# Patient Record
Sex: Female | Born: 1970 | Race: Black or African American | Hispanic: No | Marital: Single | Smoking: Heavy tobacco smoker
Health system: Southern US, Community
[De-identification: ages and names within clinical notes are randomized; demographics above are authoritative.]

## PROBLEM LIST (undated history)

## (undated) DIAGNOSIS — I10 Essential (primary) hypertension: Secondary | ICD-10-CM

## (undated) DIAGNOSIS — E119 Type 2 diabetes mellitus without complications: Secondary | ICD-10-CM

---

## 2018-10-10 ENCOUNTER — Emergency Department
Admission: EM | Admit: 2018-10-10 | Discharge: 2018-10-10 | Disposition: A | Discharge status: Home / Self Care | Payer: No Typology Code available for payment source | Attending: Emergency Medicine | Admitting: Emergency Medicine

## 2018-10-10 ENCOUNTER — Other Ambulatory Visit: Payer: Self-pay

## 2018-10-10 ENCOUNTER — Emergency Department: Payer: No Typology Code available for payment source

## 2018-10-10 ENCOUNTER — Encounter: Payer: Self-pay | Admitting: Emergency Medicine

## 2018-10-10 DIAGNOSIS — M7918 Myalgia, other site: Secondary | ICD-10-CM | POA: Diagnosis not present

## 2018-10-10 DIAGNOSIS — E119 Type 2 diabetes mellitus without complications: Secondary | ICD-10-CM | POA: Diagnosis not present

## 2018-10-10 DIAGNOSIS — F17210 Nicotine dependence, cigarettes, uncomplicated: Secondary | ICD-10-CM | POA: Diagnosis not present

## 2018-10-10 DIAGNOSIS — Y9389 Activity, other specified: Secondary | ICD-10-CM | POA: Diagnosis not present

## 2018-10-10 DIAGNOSIS — I10 Essential (primary) hypertension: Secondary | ICD-10-CM | POA: Diagnosis not present

## 2018-10-10 DIAGNOSIS — R0789 Other chest pain: Secondary | ICD-10-CM | POA: Diagnosis present

## 2018-10-10 HISTORY — DX: Essential (primary) hypertension: I10

## 2018-10-10 HISTORY — DX: Type 2 diabetes mellitus without complications: E11.9

## 2018-10-10 MED ORDER — CYCLOBENZAPRINE HCL 5 MG PO TABS: 5.0000 mg | ORAL_TABLET | Freq: Three times a day (TID) | ORAL | 0 refills | Status: AC | PRN

## 2018-10-10 MED ORDER — NAPROXEN 500 MG PO TABS
500.0000 mg | ORAL_TABLET | Freq: Once | ORAL | Status: AC
  Administered 2018-10-10: 23:00:00 500 mg via ORAL
  Filled 2018-10-10: qty 1

## 2018-10-10 MED ORDER — TRAMADOL HCL 50 MG PO TABS: 50.0000 mg | ORAL_TABLET | Freq: Three times a day (TID) | ORAL | 0 refills | Status: AC | PRN

## 2018-10-10 MED ORDER — CYCLOBENZAPRINE HCL 10 MG PO TABS
10.0000 mg | ORAL_TABLET | Freq: Once | ORAL | Status: AC
  Administered 2018-10-10: 10 mg via ORAL
  Filled 2018-10-10: qty 1

## 2018-10-10 MED ORDER — KETOROLAC TROMETHAMINE 30 MG/ML IJ SOLN: 30.0000 mg | Freq: Once | INTRAMUSCULAR | Status: DC

## 2018-10-10 MED ORDER — NAPROXEN 500 MG PO TABS: 500.0000 mg | ORAL_TABLET | Freq: Two times a day (BID) | ORAL | 0 refills | Status: AC
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
## 2018-10-10 NOTE — Discharge Instructions (Signed)
Your exam and XRs are negative. You can expect to be sore for a few days. Take the prescription meds as directed. Follow-up with your provider as needed.
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
